# Patient Record
Sex: Female | Born: 1965 | Hispanic: No | Marital: Married | Smoking: Former smoker
Health system: Southern US, Community
[De-identification: ages and names within clinical notes are randomized; demographics above are authoritative.]

## PROBLEM LIST (undated history)

## (undated) DIAGNOSIS — C50919 Malignant neoplasm of unspecified site of unspecified female breast: Secondary | ICD-10-CM

## (undated) HISTORY — PX: PLACEMENT OF BREAST IMPLANTS: SHX6334

## (undated) HISTORY — DX: Malignant neoplasm of unspecified site of unspecified female breast: C50.919

---

## 2009-11-03 ENCOUNTER — Other Ambulatory Visit: Admission: RE | Admit: 2009-11-03 | Discharge: 2009-11-03 | Payer: Self-pay | Admitting: Gynecology

## 2009-11-03 ENCOUNTER — Ambulatory Visit: Payer: Self-pay | Admitting: Gynecology

## 2010-06-18 ENCOUNTER — Ambulatory Visit
Admission: RE | Admit: 2010-06-18 | Discharge: 2010-06-18 | Disposition: A | Discharge status: Home / Self Care | Payer: Managed Care, Other (non HMO) | Source: Ambulatory Visit | Attending: Family Medicine | Admitting: Family Medicine

## 2010-06-18 ENCOUNTER — Other Ambulatory Visit: Payer: Self-pay | Admitting: Family Medicine

## 2010-06-18 DIAGNOSIS — R42 Dizziness and giddiness: Secondary | ICD-10-CM

## 2010-06-19 ENCOUNTER — Emergency Department (HOSPITAL_COMMUNITY)
Admission: EM | Admit: 2010-06-19 | Discharge: 2010-06-19 | Disposition: A | Discharge status: Home / Self Care | Payer: 59 | Attending: Emergency Medicine | Admitting: Emergency Medicine

## 2010-06-19 DIAGNOSIS — R42 Dizziness and giddiness: Secondary | ICD-10-CM | POA: Insufficient documentation

## 2010-06-19 DIAGNOSIS — R110 Nausea: Secondary | ICD-10-CM | POA: Insufficient documentation

## 2010-06-19 LAB — URINALYSIS, ROUTINE W REFLEX MICROSCOPIC
Bilirubin Urine: NEGATIVE
Glucose, UA: NEGATIVE mg/dL
Hgb urine dipstick: NEGATIVE
Ketones, ur: NEGATIVE mg/dL
pH: 7 (ref 5.0–8.0)

## 2010-06-19 LAB — DIFFERENTIAL
Lymphocytes Relative: 45 % (ref 12–46)
Lymphs Abs: 2.5 10*3/uL (ref 0.7–4.0)
Monocytes Absolute: 0.4 10*3/uL (ref 0.1–1.0)
Monocytes Relative: 8 % (ref 3–12)
Neutro Abs: 2.6 10*3/uL (ref 1.7–7.7)
Neutrophils Relative %: 45 % (ref 43–77)

## 2010-06-19 LAB — CBC
HCT: 36.7 % (ref 36.0–46.0)
Hemoglobin: 12.1 g/dL (ref 12.0–15.0)
MCH: 31 pg (ref 26.0–34.0)
MCHC: 33 g/dL (ref 30.0–36.0)
MCV: 94.1 fL (ref 78.0–100.0)
RBC: 3.9 MIL/uL (ref 3.87–5.11)

## 2010-06-19 LAB — POCT I-STAT, CHEM 8
BUN: 6 mg/dL (ref 6–23)
Chloride: 105 mEq/L (ref 96–112)
Creatinine, Ser: 1 mg/dL (ref 0.4–1.2)
Sodium: 141 mEq/L (ref 135–145)

## 2011-07-15 IMAGING — CT CT HEAD W/O CM
2 series · 16 of 30 positions shown, 20 images · non-contrast
Comparison: None.

CLINICAL DATA: Vertigo.

CT HEAD WITHOUT CONTRAST
TECHNIQUE: Contiguous axial images were obtained from the base of
the skull through the vertex without contrast.

[Series 3: head bone · axial · 0.49mm/px · z∈[+43,+90]mm · 3 of 32 slices shown]
[im 3/32  bone]
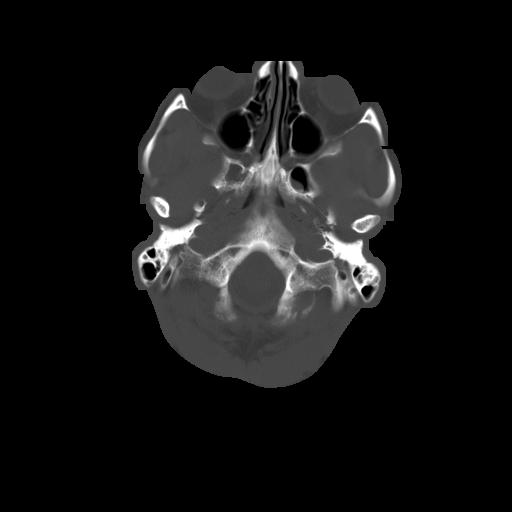
[im 7/32  bone]
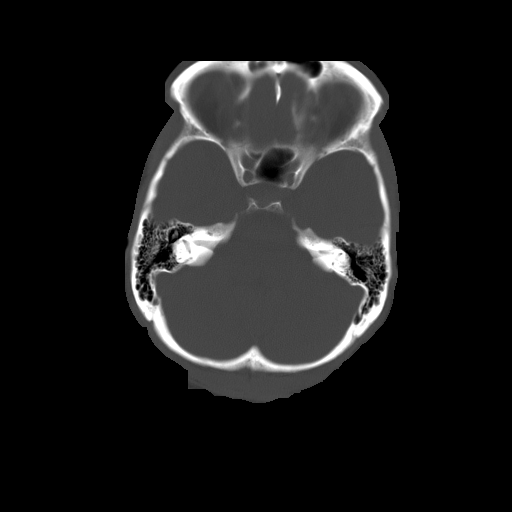
[im 12/32  bone]
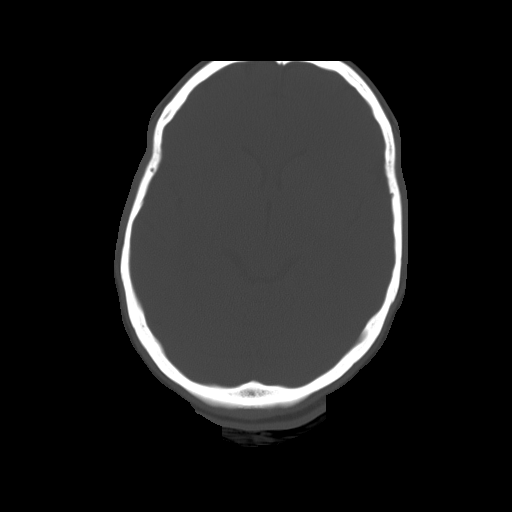

[Series 32: 3d filtered head w/o · axial · non-contrast · 0.49mm/px · z∈[+43,+179]mm · 13 of 32 slices shown, 17 images]
[im 3/32  brain]
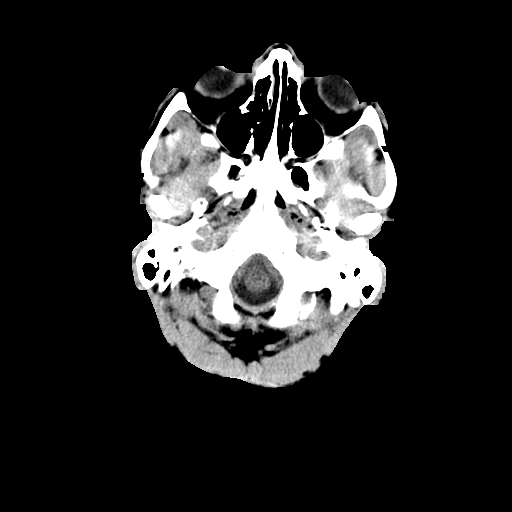
[im 3/32  bone]
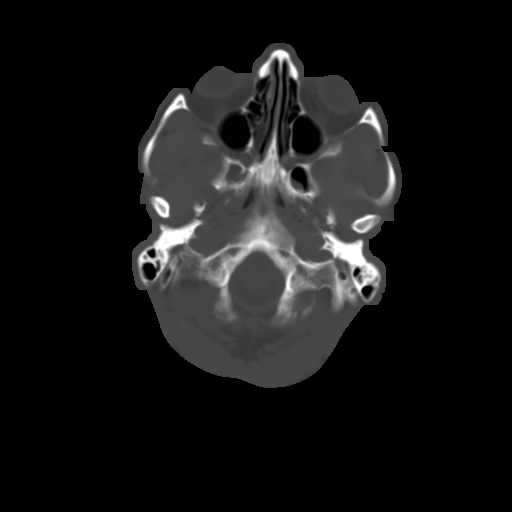
[im 5/32  brain]
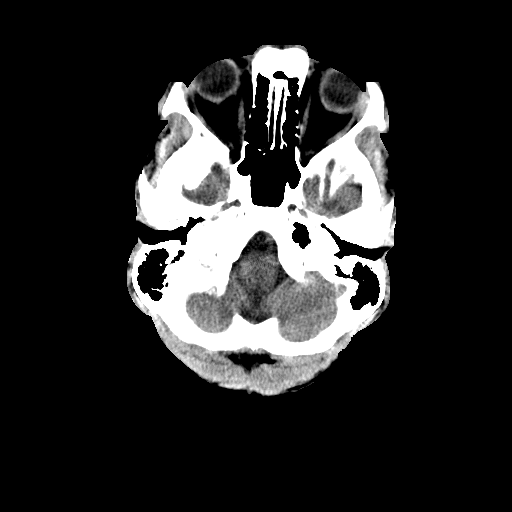
[im 7/32  brain]
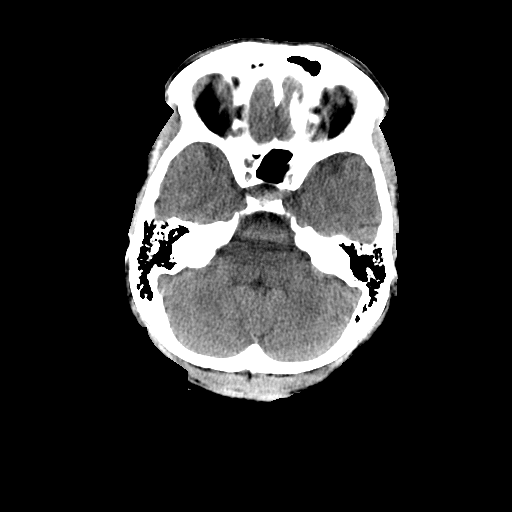
[im 9/32  brain]
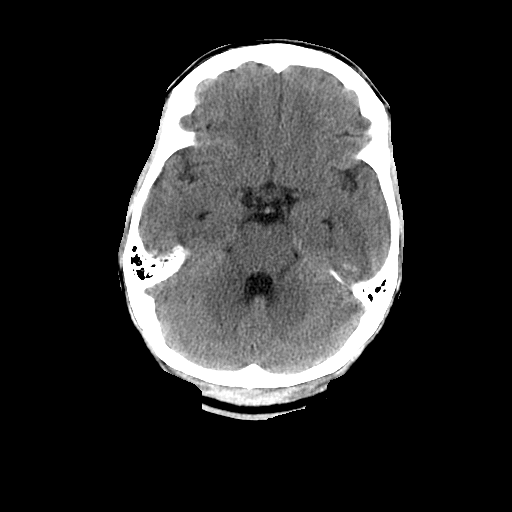
[im 12/32  brain]
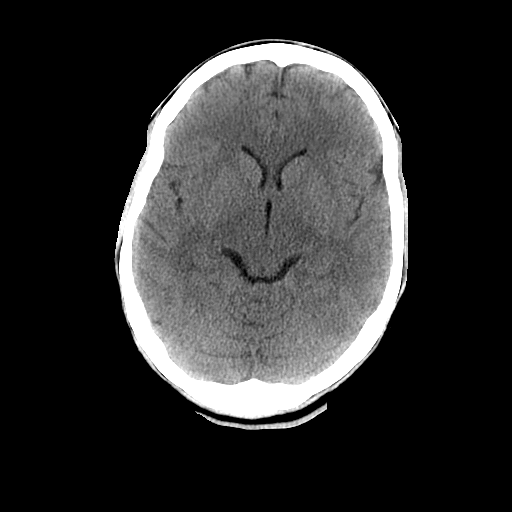
[im 12/32  bone]
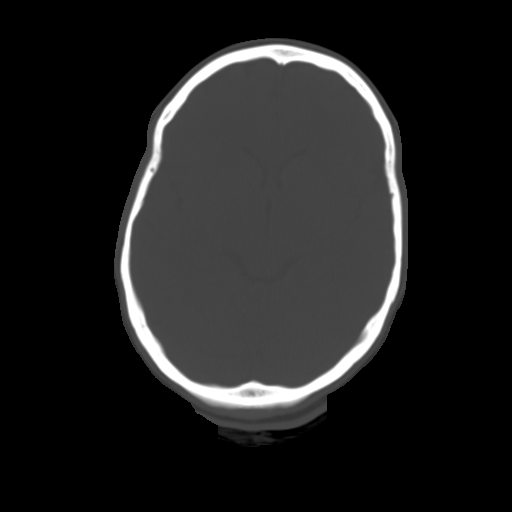
[im 14/32  brain]
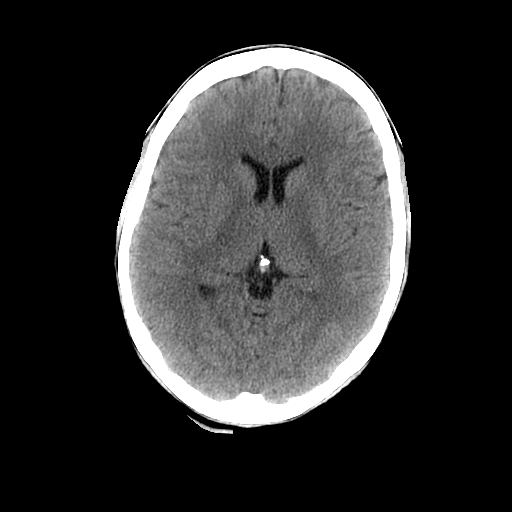
[im 16/32  brain]
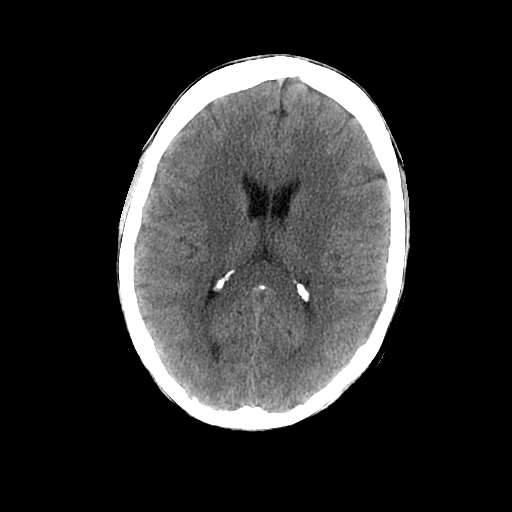
[im 18/32  brain]
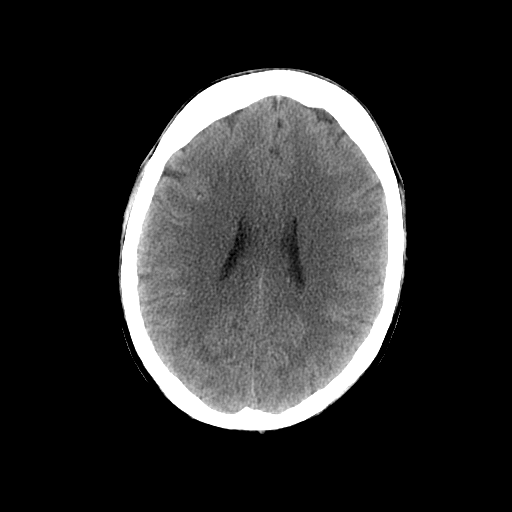
[im 20/32  brain]
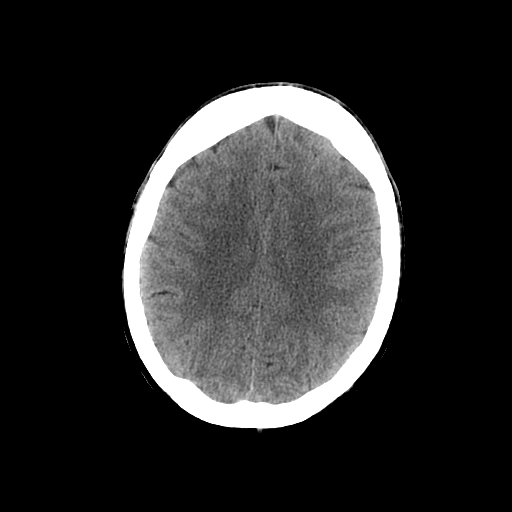
[im 20/32  bone]
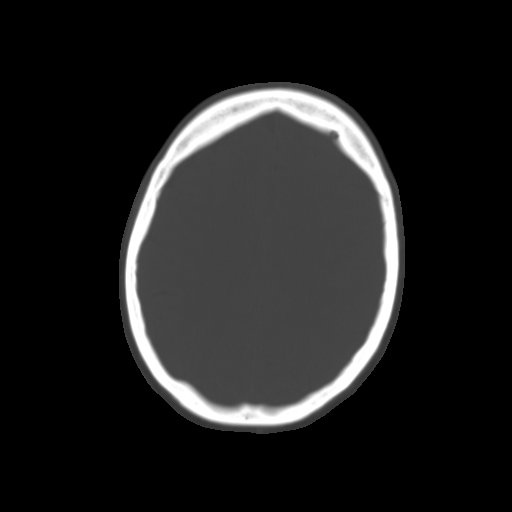
[im 23/32  brain]
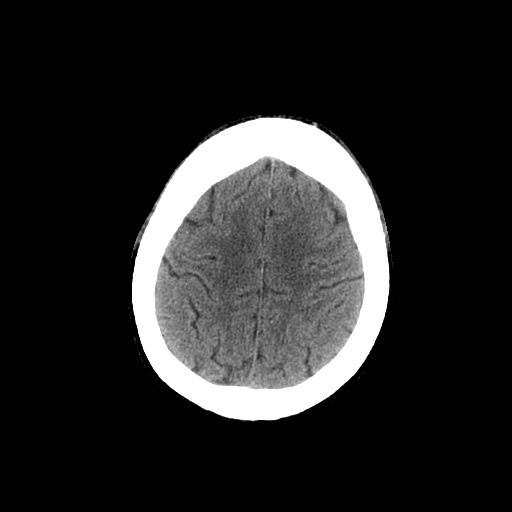
[im 25/32  brain]
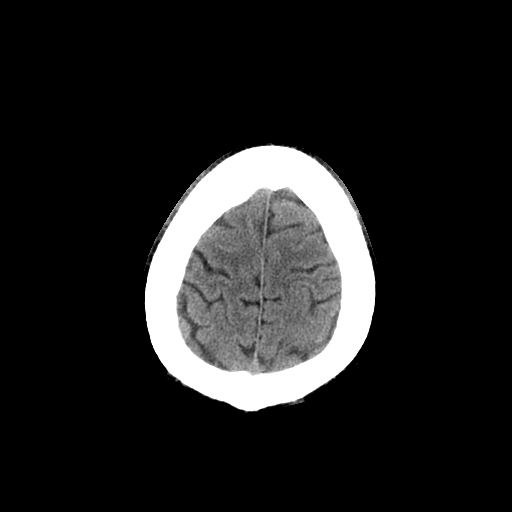
[im 27/32  brain]
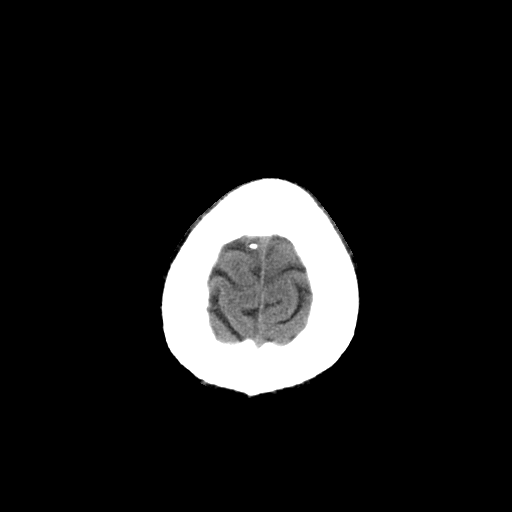
[im 29/32  brain]
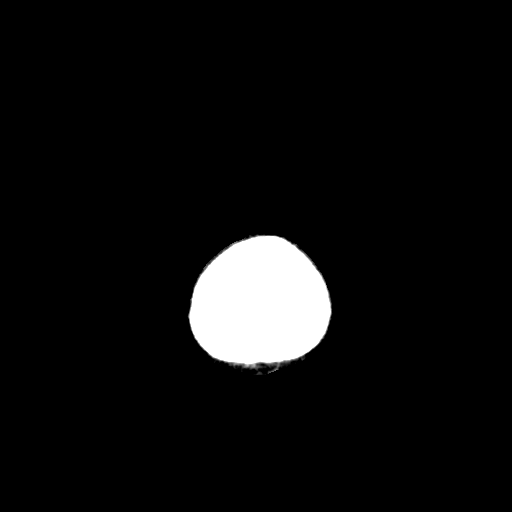
[im 29/32  bone]
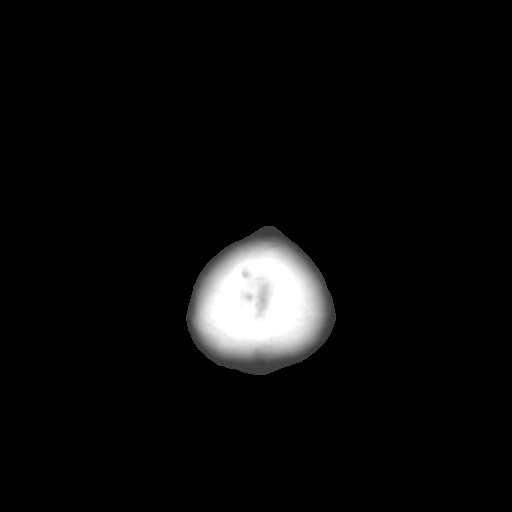

[16 of 30 positions shown; findings below may reference images not displayed]

FINDINGS: The brain stem, cerebellum, cerebral peduncles, thalami,
basal ganglia, basilar cisterns, and ventricular system appear
unremarkable.

No intracranial hemorrhage, mass lesion, or acute infarction is
identified.
IMPRESSION: No significant abnormality identified.

## 2019-08-13 ENCOUNTER — Telehealth: Payer: Self-pay | Admitting: General Practice

## 2019-08-13 NOTE — Telephone Encounter (Signed)
LMVM to schedule new patient appointment referred to [place] from [facility]. She is having a total hip replacement on June 1 and needs a PCP. I suggested [place] to have the soonest appointment. [facility] is sending over information by fax for this patient.

## 2019-09-24 NOTE — Telephone Encounter (Signed)
I have the patient scheduled for July 13 at 4 PM

## 2019-09-24 NOTE — Telephone Encounter (Signed)
LMVM for the patient to contact the office to schedule a new patient appointment. This patient was referred to [place] from [facility].

## 2019-09-24 NOTE — Telephone Encounter (Signed)
Pts spoused called back and he stated that the nwpt appointments are too far out for his wife b/c she is having surgery in July and need something this week or next week.  He stated that he will call another office to see if he can get someone sooner if not he will give the office a call back to schedule her for a nwpt appointment.

## 2019-09-25 ENCOUNTER — Encounter: Payer: Self-pay | Admitting: Family Medicine

## 2019-09-25 DIAGNOSIS — M1631 Unilateral osteoarthritis resulting from hip dysplasia, right hip: Secondary | ICD-10-CM | POA: Insufficient documentation

## 2019-10-08 ENCOUNTER — Other Ambulatory Visit: Payer: Self-pay

## 2019-10-08 ENCOUNTER — Encounter: Payer: Self-pay | Admitting: Adult Health

## 2019-10-08 ENCOUNTER — Ambulatory Visit (INDEPENDENT_AMBULATORY_CARE_PROVIDER_SITE_OTHER): Payer: No Typology Code available for payment source | Admitting: Adult Health

## 2019-10-08 VITALS — BP 118/90 | Temp 98.6°F | Wt 157.0 lb

## 2019-10-08 DIAGNOSIS — Z01818 Encounter for other preprocedural examination: Secondary | ICD-10-CM

## 2019-10-08 DIAGNOSIS — Z7689 Persons encountering health services in other specified circumstances: Secondary | ICD-10-CM | POA: Diagnosis not present

## 2019-10-08 NOTE — Progress Notes (Signed)
Patient presents to clinic today to establish care. She is a pleasant 54 year old female who  has a past medical history of Breast cancer ([place]).   Acute Concerns: Establish Care   Surgical Consult - she is having a right total hip done done by Dr.Alusio on July 20th and needs pre surgical screening done  Chronic Issues: History of Breast Cancer - is followed by Oncology in [place] every year. She does not want to see an oncologist here   Health Maintenance: Dental -- Routine Care Vision -- Routine Care Immunizations -- Has not had Covid vaccination yet.  Colonoscopy -- Never had  Mammogram -- yearly  PAP -- 2019     Past Medical History:  Diagnosis Date  . Breast cancer [facility])    left     Past Surgical History:  Procedure Laterality Date  . PLACEMENT OF BREAST IMPLANTS      No current outpatient medications on file prior to visit.   No current facility-administered medications on file prior to visit.    No Known Allergies  Family History  Problem Relation Age of Onset  . Hearing loss Mother   . Hypertension Mother   . Arthritis Paternal Grandfather   . Hearing loss Paternal Grandfather     Social History   Socioeconomic History  . Marital status: Married    Spouse name: Not on file  . Number of children: Not on file  . Years of education: Not on file  . Highest education level: Not on file  Occupational History  . Not on file  Tobacco Use  . Smoking status: Former Research scientist (life sciences)  . Smokeless tobacco: Never Used  Vaping Use  . Vaping Use: Never used  Substance and Sexual Activity  . Alcohol use: Yes    Comment: 1-2 MONTHLY  . Drug use: Not on file  . Sexual activity: Not on file  Other Topics Concern  . Not on file  Social History Narrative  . Not on file   Social Determinants of Health   Financial Resource Strain:   . Difficulty of Paying Living Expenses:   Food Insecurity:   . Worried About Charity fundraiser in the Last Year:   . Youth worker in the Last Year:   Transportation Needs:   . Film/video editor (Medical):   Marland Kitchen Lack of Transportation (Non-Medical):   Physical Activity:   . Days of Exercise per Week:   . Minutes of Exercise per Session:   Stress:   . Feeling of Stress :   Social Connections:   . Frequency of Communication with Friends and Family:   . Frequency of Social Gatherings with Friends and Family:   . Attends Religious Services:   . Active Member of Clubs or Organizations:   . Attends Archivist Meetings:   Marland Kitchen Marital Status:   Intimate Partner Violence:   . Fear of Current or Ex-Partner:   . Emotionally Abused:   Marland Kitchen Physically Abused:   . Sexually Abused:     Review of Systems  Constitutional: Negative.   HENT: Negative.   Eyes: Negative.   Respiratory: Negative.   Cardiovascular: Negative.   Gastrointestinal: Negative.   Genitourinary: Negative.   Musculoskeletal: Positive for joint pain (right hip ).  Skin: Negative.   Neurological: Negative.   Endo/Heme/Allergies: Negative.   Psychiatric/Behavioral: Negative.   All other systems reviewed and are negative.   BP 118/90   Temp 98.6 F (37 C)  Wt 157 lb (71.2 kg)   Physical Exam Vitals and nursing note reviewed.  Constitutional:      General: She is not in acute distress.    Appearance: Normal appearance. She is well-developed. She is not ill-appearing.  HENT:     Head: Normocephalic and atraumatic.     Right Ear: Tympanic membrane, ear canal and external ear normal. There is no impacted cerumen.     Left Ear: Tympanic membrane, ear canal and external ear normal. There is no impacted cerumen.     Nose: Nose normal. No congestion or rhinorrhea.     Mouth/Throat:     Mouth: Mucous membranes are moist.     Pharynx: Oropharynx is clear. No oropharyngeal exudate or posterior oropharyngeal erythema.  Eyes:     General:        Right eye: No discharge.        Left eye: No discharge.     Extraocular Movements:  Extraocular movements intact.     Conjunctiva/sclera: Conjunctivae normal.     Pupils: Pupils are equal, round, and reactive to light.  Neck:     Thyroid: No thyromegaly.     Vascular: No carotid bruit.     Trachea: No tracheal deviation.  Cardiovascular:     Rate and Rhythm: Normal rate and regular rhythm.     Pulses: Normal pulses.     Heart sounds: Normal heart sounds. No murmur heard.  No friction rub. No gallop.   Pulmonary:     Effort: Pulmonary effort is normal. No respiratory distress.     Breath sounds: Normal breath sounds. No stridor. No wheezing, rhonchi or rales.  Chest:     Chest wall: No tenderness.  Abdominal:     General: Abdomen is flat. Bowel sounds are normal. There is no distension.     Palpations: Abdomen is soft. There is no mass.     Tenderness: There is no abdominal tenderness. There is no right CVA tenderness, left CVA tenderness, guarding or rebound.     Hernia: No hernia is present.  Musculoskeletal:        General: No swelling, tenderness, deformity or signs of injury. Normal range of motion.     Cervical back: Normal range of motion and neck supple.     Right lower leg: No edema.     Left lower leg: No edema.  Lymphadenopathy:     Cervical: No cervical adenopathy.  Skin:    General: Skin is warm and dry.     Coloration: Skin is not jaundiced or pale.     Findings: No bruising, erythema, lesion or rash.  Neurological:     General: No focal deficit present.     Mental Status: She is alert and oriented to person, place, and time.     Cranial Nerves: No cranial nerve deficit.     Sensory: No sensory deficit.     Motor: No weakness.     Coordination: Coordination normal.     Gait: Gait normal.     Deep Tendon Reflexes: Reflexes normal.     Comments: Walks with slight limp   Psychiatric:        Mood and Affect: Mood normal.        Behavior: Behavior normal.        Thought Content: Thought content normal.        Judgment: Judgment normal.     Assessment/Plan: 1. Encounter to establish care -We will have her follow-up in the next 2  to 3 months for her CPE.  She will bring her medication records with her.  2. Pre-operative clearance  - CBC with Differential/Platelet - EKG 12-Lead- Sinus  Rhythm  - occasional PAC    # PACs = 1.WITHIN NORMAL LIMITS Rate 76 - CMP - Protime-INR  Dorothyann Peng, NP

## 2019-10-08 NOTE — Patient Instructions (Signed)
It was great meeting you today   Once I get your blood work back from the lab I will let you know what it shows and will send all your information over to Dr. Maureen Ralphs   Your EKG looks normal   Lets follow up in 2 months for your physical exam - we will do more blood work there and get you caught up on everything

## 2019-10-09 LAB — CBC WITH DIFFERENTIAL/PLATELET
Absolute Monocytes: 468 cells/uL (ref 200–950)
Basophils Absolute: 31 cells/uL (ref 0–200)
Basophils Relative: 0.6 %
Eosinophils Absolute: 10 cells/uL — ABNORMAL LOW (ref 15–500)
Eosinophils Relative: 0.2 %
HCT: 36.8 % (ref 35.0–45.0)
Hemoglobin: 12 g/dL (ref 11.7–15.5)
Lymphs Abs: 1685 cells/uL (ref 850–3900)
MCH: 30.1 pg (ref 27.0–33.0)
MCHC: 32.6 g/dL (ref 32.0–36.0)
MCV: 92.2 fL (ref 80.0–100.0)
MPV: 10.9 fL (ref 7.5–12.5)
Monocytes Relative: 9 %
Neutro Abs: 3006 cells/uL (ref 1500–7800)
Neutrophils Relative %: 57.8 %
Platelets: 288 10*3/uL (ref 140–400)
RBC: 3.99 10*6/uL (ref 3.80–5.10)
RDW: 12.7 % (ref 11.0–15.0)
Total Lymphocyte: 32.4 %
WBC: 5.2 10*3/uL (ref 3.8–10.8)

## 2019-10-09 LAB — COMPREHENSIVE METABOLIC PANEL
AG Ratio: 1.5 (calc) (ref 1.0–2.5)
ALT: 12 U/L (ref 6–29)
AST: 18 U/L (ref 10–35)
Albumin: 4.5 g/dL (ref 3.6–5.1)
Alkaline phosphatase (APISO): 118 U/L (ref 37–153)
BUN: 11 mg/dL (ref 7–25)
CO2: 26 mmol/L (ref 20–32)
Calcium: 9.9 mg/dL (ref 8.6–10.4)
Chloride: 104 mmol/L (ref 98–110)
Creat: 0.78 mg/dL (ref 0.50–1.05)
Globulin: 3 g/dL (calc) (ref 1.9–3.7)
Glucose, Bld: 91 mg/dL (ref 65–99)
Potassium: 4.7 mmol/L (ref 3.5–5.3)
Sodium: 139 mmol/L (ref 135–146)
Total Bilirubin: 0.5 mg/dL (ref 0.2–1.2)
Total Protein: 7.5 g/dL (ref 6.1–8.1)

## 2019-10-09 LAB — PROTIME-INR
INR: 1
Prothrombin Time: 10.8 s (ref 9.0–11.5)

## 2019-12-20 ENCOUNTER — Encounter: Payer: Self-pay | Admitting: Adult Health

## 2019-12-20 ENCOUNTER — Other Ambulatory Visit: Payer: Self-pay

## 2019-12-20 ENCOUNTER — Ambulatory Visit (INDEPENDENT_AMBULATORY_CARE_PROVIDER_SITE_OTHER): Payer: No Typology Code available for payment source | Admitting: Adult Health

## 2019-12-20 VITALS — BP 100/70 | HR 68 | Ht 63.5 in | Wt 153.5 lb

## 2019-12-20 DIAGNOSIS — Z853 Personal history of malignant neoplasm of breast: Secondary | ICD-10-CM

## 2019-12-20 DIAGNOSIS — Z0000 Encounter for general adult medical examination without abnormal findings: Secondary | ICD-10-CM

## 2019-12-20 DIAGNOSIS — Z1211 Encounter for screening for malignant neoplasm of colon: Secondary | ICD-10-CM | POA: Diagnosis not present

## 2019-12-20 NOTE — Progress Notes (Signed)
Subjective:    Patient ID: Catherine Case, female    DOB: 10/23/1965, 54 y.o.   MRN: [id number]  HPI Patient presents for yearly preventative medicine examination. She is a pleasant 54 year old female who  has a past medical history of Breast cancer ([place]).  H/O breast cancer -she is followed by oncology in [place] every year.  She wishes to go down there and does not want to see an oncologist in [place]. She finished treatment in June 2020  H/o of right total hip -she had a right total hip done by Dr. Maureen Ralphs on October 15 2019. She denies complications or pain.   GYN/mammogram - she continues to go to [place] for GYN care yearly and has mammograms there. She does not want to see anyone here.   All immunizations and health maintenance protocols were reviewed with the patient and needed orders were placed.  Appropriate screening laboratory values were ordered for the patient including screening of hyperlipidemia, renal function and hepatic function.  Medication reconciliation,  past medical history, social history, problem list and allergies were reviewed in detail with the patient  Goals were established with regard to weight loss, exercise, and  diet in compliance with medications. She stays active and eats a healthy diet.    Review of Systems  Constitutional: Negative.   HENT: Negative.   Eyes: Negative.   Respiratory: Negative.   Cardiovascular: Negative.   Gastrointestinal: Negative.   Endocrine: Negative.   Genitourinary: Negative.   Musculoskeletal: Negative.   Skin: Negative.   Allergic/Immunologic: Negative.   Neurological: Negative.   Hematological: Negative.   Psychiatric/Behavioral: Negative.    Past Medical History:  Diagnosis Date   Breast cancer ([place])    left     Social History   Socioeconomic History   Marital status: Married    Spouse name: Not on file   Number of children: Not on file   Years of education: Not on file   Highest education  level: Not on file  Occupational History   Not on file  Tobacco Use   Smoking status: Former Smoker   Smokeless tobacco: Never Used  Scientific laboratory technician Use: Never used  Substance and Sexual Activity   Alcohol use: Yes    Comment: 1-2 MONTHLY   Drug use: Not on file   Sexual activity: Not on file  Other Topics Concern   Not on file  Social History Narrative   Not on file   Social Determinants of Health   Financial Resource Strain:    Difficulty of Paying Living Expenses: Not on file  Food Insecurity:    Worried About [place] in the Last Year: Not on file   Ran Out of Food in the Last Year: Not on file  Transportation Needs:    Lack of Transportation (Medical): Not on file   Lack of Transportation (Non-Medical): Not on file  Physical Activity:    Days of Exercise per Week: Not on file   Minutes of Exercise per Session: Not on file  Stress:    Feeling of Stress : Not on file  Social Connections:    Frequency of Communication with Friends and Family: Not on file   Frequency of Social Gatherings with Friends and Family: Not on file   Attends Religious Services: Not on file   Active Member of Clubs or Organizations: Not on file   Attends Archivist Meetings: Not on file   Marital Status: Not  on file  Intimate Partner Violence:    Fear of Current or Ex-Partner: Not on file   Emotionally Abused: Not on file   Physically Abused: Not on file   Sexually Abused: Not on file    Past Surgical History:  Procedure Laterality Date   PLACEMENT OF BREAST IMPLANTS      Family History  Problem Relation Age of Onset   Hearing loss Mother    Hypertension Mother    Arthritis Paternal Grandfather    Hearing loss Paternal Grandfather     No Known Allergies  No current outpatient medications on file prior to visit.   No current facility-administered medications on file prior to visit.    BP 100/70    Pulse 68    Ht 5' 3.5"  (1.613 m)    Wt 153 lb 8 oz (69.6 kg)    SpO2 92%    BMI 26.76 kg/m       Objective:   Physical Exam Vitals and nursing note reviewed.  Constitutional:      General: She is not in acute distress.    Appearance: Normal appearance. She is well-developed. She is not ill-appearing.  HENT:     Head: Normocephalic and atraumatic.     Right Ear: Tympanic membrane, ear canal and external ear normal. There is no impacted cerumen.     Left Ear: Tympanic membrane, ear canal and external ear normal. There is no impacted cerumen.     Nose: Nose normal. No congestion or rhinorrhea.     Mouth/Throat:     Mouth: Mucous membranes are moist.     Pharynx: Oropharynx is clear. No oropharyngeal exudate or posterior oropharyngeal erythema.  Eyes:     General:        Right eye: No discharge.        Left eye: No discharge.     Extraocular Movements: Extraocular movements intact.     Conjunctiva/sclera: Conjunctivae normal.     Pupils: Pupils are equal, round, and reactive to light.  Neck:     Thyroid: No thyromegaly.     Vascular: No carotid bruit.     Trachea: No tracheal deviation.  Cardiovascular:     Rate and Rhythm: Normal rate and regular rhythm.     Pulses: Normal pulses.     Heart sounds: Normal heart sounds. No murmur heard.  No friction rub. No gallop.   Pulmonary:     Effort: Pulmonary effort is normal. No respiratory distress.     Breath sounds: Normal breath sounds. No stridor. No wheezing, rhonchi or rales.  Chest:     Chest wall: No tenderness.  Abdominal:     General: Abdomen is flat. Bowel sounds are normal. There is no distension.     Palpations: Abdomen is soft. There is no mass.     Tenderness: There is no abdominal tenderness. There is no right CVA tenderness, left CVA tenderness, guarding or rebound.     Hernia: No hernia is present.  Musculoskeletal:        General: No swelling, tenderness, deformity or signs of injury. Normal range of motion.     Cervical back: Normal  range of motion and neck supple.     Right lower leg: No edema.     Left lower leg: No edema.  Lymphadenopathy:     Cervical: No cervical adenopathy.  Skin:    General: Skin is warm and dry.     Coloration: Skin is not jaundiced or pale.  Findings: No bruising, erythema, lesion or rash.  Neurological:     General: No focal deficit present.     Mental Status: She is alert and oriented to person, place, and time.     Cranial Nerves: No cranial nerve deficit.     Sensory: No sensory deficit.     Motor: No weakness.     Coordination: Coordination normal.     Gait: Gait normal.     Deep Tendon Reflexes: Reflexes normal.  Psychiatric:        Mood and Affect: Mood normal.        Behavior: Behavior normal.        Thought Content: Thought content normal.        Judgment: Judgment normal.       Assessment & Case:  1. Routine general medical examination at a health care facility - Continue to stay active and exercise  - CBC with Differential/Platelet; Future - Hemoglobin A1c; Future - Lipid panel; Future - TSH; Future - CMP with eGFR([facility]); Future  2. History of breast cancer - Follow up with Oncologist and GYN in [place]   3. Colon cancer screening  - Ambulatory referral to Gastroenterology  Dorothyann Peng, NP

## 2019-12-20 NOTE — Patient Instructions (Addendum)
It was great seeing you today   We will follow up with you regarding your blood work   I am going to refer you for a colonoscopy, they will call you to schedule your appointment     Colonoscopia en los adultos Colonoscopy, Adult [place] colonoscopa es un examen que se realiza para examinar el intestino grueso. Se realiza [employer] de un tubo [place], fino y flexible que tiene una cmara en el extremo. Este examen se realiza para detectar si hay problemas, por ejemplo:  Bultos (tumores).  Crecimientos (plipos).  Irritacin e hinchazn(inflamacin).  Sangrado. Consulte al mdico acerca de lo siguiente:  Cualquier alergia que tenga.  Todos los [employer] toma. Infrmele sobre vitaminas, hierbas, gotas oftlmicas, cremas y medicamentos de venta libre.  Cualquier problema previo que usted o algn miembro de su familia hayan tenido con los anestsicos.  Cualquier trastorno de la sangre que tenga.  Cirugas a las que se haya sometido.  Cualquier afeccin mdica que tenga.  Si est embarazada o podra estarlo.  Cualquier problema que haya tenido al defecar ( deposiciones). Cules son los riesgos? En general, se trata de un procedimiento seguro. Sin embargo, pueden presentarse problemas, por ejemplo:  Sangrado.  Dao intestinal.  Reacciones alrgicas a los medicamentos administrados durante el procedimiento.  Infeccin. Esto es poco frecuente. Qu ocurre antes del procedimiento? Comida y bebida Siga las indicaciones del mdico respecto de las comidas y las bebidas. Estas incluyen:  [employer] del procedimiento: ? Siga una dieta con bajo contenido de [place]. ? Evite estos alimentos:  Frutos secos.  Semillas.  Frutas pasas.  Frutas crudas.  Vegetales.  [place] 1 y 3 das antes del procedimiento: ? Coma solo postre de gelatina o helados de [place]. ? Beba solamente lquidos transparentes, por ejemplo:  [place].  Caldos o sopas transparentes.  Caf  negro o t.  Jugos transparentes.  Refrescos o bebidas deportivas transparentes. ? No beba lquidos con colorante rojo o morado.  El da del procedimiento: ? No coma alimentos slidos. Puede continuar bebiendo lquidos claros hasta 2 horas antes del procedimiento. ? No coma ni beba nada a partir de 2 horas antes al procedimiento o segn le haya indicado el mdico. Preparado intestinal Si le recetaron un preparado intestinal para tomar por la boca (por va oral) para limpiar el colon:  Tmelo como se lo haya indicado el mdico. A partir del da anterior al procedimiento, tendr que beber una gran cantidad de un medicamento lquido. El lquido lo har defecar hasta que la materia fecal sea casi transparente o de color verde claro.  Si la piel o la zona anal se le irritan debido a Building services engineer, Fish farm manager lo siguiente: ? Limpie la zona con toallitas que contengan productos medicinales, por ejemplo, toallitas hmedas para adultos con aloe y vitaminaE. ? Aplquese un producto en la piel que suavice la zona, como vaselina.  Si vomita mientras toma el preparado intestinal: ? Lorayne Marek pausa durante 60 minutos como mximo. ? Comience a tomar el preparado nuevamente. ? Llame al mdico si sigue vomitando y no puede tomar el preparado intestinal sin vomitar.  Para limpiarle el colon, tambin pueden darle: ? Medicamentos laxantes. Estos ayudan a defecar. ? Instrucciones para el uso de un medicamento lquido (enema) inyectado en el ano. Medicamentos Consulte al mdico si debe hacer o no lo siguiente:  Quarry manager o suspender los medicamentos que recibe normalmente. Esto es importante.  Tomar aspirina e ibuprofeno. No tome estos medicamentos a menos  que el mdico se lo indique.  Tomar medicamentos de [employer], vitaminas, hierbas y suplementos. Instrucciones generales  Pregntele al mdico qu medidas se tomarn para evitar la propagacin de grmenes. Estas pueden incluir lavar la piel con un  jabn antisptico.  Prepare que alguien lo lleve a su casa desde el hospital o la clnica. Qu ocurre durante el procedimiento?   Pueden colocarle un tubo (catter) intravenoso en una de las venas.  Pueden administrarle uno o ms de los siguientes medicamentos: ? Un medicamento para ayudar a relajarse (sedante). ? Un medicamento para adormecer la zona (anestesia local). ? Un medicamento que lo har dormir (anestesia general). Esto debe realizarse en contadas ocasiones.  Se recostar de costado con las rodillas flexionadas.  El tubo: ? Estar recubierto de aceite o gel. ? Se colocar en la abertura del ano. ? Se introducir suavemente en el intestino grueso.  Le aplicarn aire en el colon para mantenerlo abierto. Es posible que sienta presin o clicos.  La cmara se utilizar para tomar fotos que Education officer, community.  Podrn tomarle una pequea muestra de tejido (biopsia) para examinarla.  Si se encuentran pequeos crecimientos, el mdico puede extirparlos y analizarlos para Product manager presencia de cncer.  El tubo se extraer lentamente. Este procedimiento puede variar segn el mdico y el hospital. Sander Nephew ocurre despus del procedimiento?  Lo controlarn hasta que deje el hospital o la [place]. Esto requiere el control de: ? Presin arterial. ? Frecuencia cardaca. ? Frecuencia respiratoria. ? Nivel de oxgeno en la sangre.  Es posible que encuentre una pequea cantidad de sangre en la materia fecal.  Puede eliminar gases.  Puede sentir clicos leves o distensin en el abdomen.  No conduzca durante 24horas despus del procedimiento.  Es su responsabilidad retirar [employer] del procedimiento. Pregntele al mdico o consulte en el departamento que Multimedia programmer procedimiento cundo [employer]. Resumen  [place] colonoscopa es un examen que se realiza para examinar el intestino grueso.  Siga las instrucciones de su mdico acerca de comer y beber  antes del procedimiento.  Es posible que le receten un preparado intestinal por va oral para limpiar el colon. Tmelo como se lo haya indicado el mdico.  Se introducir un tubo flexible con una cmara en su extremo en el orificio del ano. Se introducir en el intestino grueso.  Lo controlarn hasta que deje el hospital o la [place]. Esta informacin no tiene Marine scientist el consejo del mdico. Asegrese de hacerle al mdico cualquier pregunta que tenga. Document Revised: 11/28/2018 Document Reviewed: 11/28/2018 Elsevier Patient Education  [place].

## 2019-12-21 LAB — CBC WITH DIFFERENTIAL/PLATELET
Absolute Monocytes: 514 cells/uL (ref 200–950)
Basophils Absolute: 29 cells/uL (ref 0–200)
Basophils Relative: 0.6 %
Eosinophils Absolute: 62 cells/uL (ref 15–500)
Eosinophils Relative: 1.3 %
HCT: 34.5 % — ABNORMAL LOW (ref 35.0–45.0)
Hemoglobin: 11 g/dL — ABNORMAL LOW (ref 11.7–15.5)
Lymphs Abs: 1848 cells/uL (ref 850–3900)
MCH: 28.6 pg (ref 27.0–33.0)
MCHC: 31.9 g/dL — ABNORMAL LOW (ref 32.0–36.0)
MCV: 89.6 fL (ref 80.0–100.0)
MPV: 11 fL (ref 7.5–12.5)
Monocytes Relative: 10.7 %
Neutro Abs: 2347 cells/uL (ref 1500–7800)
Neutrophils Relative %: 48.9 %
Platelets: 308 10*3/uL (ref 140–400)
RBC: 3.85 10*6/uL (ref 3.80–5.10)
RDW: 13.3 % (ref 11.0–15.0)
Total Lymphocyte: 38.5 %
WBC: 4.8 10*3/uL (ref 3.8–10.8)

## 2019-12-21 LAB — LIPID PANEL
Cholesterol: 186 mg/dL (ref <200)
HDL: 65 mg/dL (ref >=50)
LDL Cholesterol (Calc): 107 mg/dL (calc) — ABNORMAL HIGH
Non-HDL Cholesterol (Calc): 121 mg/dL (calc) (ref <130)
Total CHOL/HDL Ratio: 2.9 (calc) (ref <5.0)
Triglycerides: 57 mg/dL (ref <150)

## 2019-12-21 LAB — COMPLETE METABOLIC PANEL WITH GFR
AG Ratio: 1.3 (calc) (ref 1.0–2.5)
ALT: 8 U/L (ref 6–29)
AST: 16 U/L (ref 10–35)
Albumin: 4.4 g/dL (ref 3.6–5.1)
Alkaline phosphatase (APISO): 121 U/L (ref 37–153)
BUN/Creatinine Ratio: 8 (calc) (ref 6–22)
BUN: 6 mg/dL — ABNORMAL LOW (ref 7–25)
CO2: 27 mmol/L (ref 20–32)
Calcium: 9.7 mg/dL (ref 8.6–10.4)
Chloride: 104 mmol/L (ref 98–110)
Creat: 0.73 mg/dL (ref 0.50–1.05)
GFR, Est African American: 108 mL/min/{1.73_m2} (ref >=60)
GFR, Est Non African American: 93 mL/min/{1.73_m2} (ref >=60)
Globulin: 3.3 g/dL (calc) (ref 1.9–3.7)
Glucose, Bld: 77 mg/dL (ref 65–99)
Potassium: 4.4 mmol/L (ref 3.5–5.3)
Sodium: 139 mmol/L (ref 135–146)
Total Bilirubin: 0.4 mg/dL (ref 0.2–1.2)
Total Protein: 7.7 g/dL (ref 6.1–8.1)

## 2019-12-21 LAB — HEMOGLOBIN A1C
Hgb A1c MFr Bld: 5.8 % of total Hgb — ABNORMAL HIGH (ref <5.7)
Mean Plasma Glucose: 120 (calc)
eAG (mmol/L): 6.6 (calc)

## 2019-12-21 LAB — TSH: TSH: 2.27 mIU/L
